# Patient Record
Sex: Female | Born: 2000 | Race: White | Hispanic: No | Marital: Single | Smoking: Never smoker
Health system: Southern US, Community
[De-identification: ages and names within clinical notes are randomized; demographics above are authoritative.]

## PROBLEM LIST (undated history)

## (undated) DIAGNOSIS — Z862 Personal history of diseases of the blood and blood-forming organs and certain disorders involving the immune mechanism: Secondary | ICD-10-CM

---
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
## 2022-07-09 ENCOUNTER — Ambulatory Visit (INDEPENDENT_AMBULATORY_CARE_PROVIDER_SITE_OTHER): Payer: No Typology Code available for payment source | Admitting: Dermatology

## 2022-07-09 ENCOUNTER — Encounter: Payer: Self-pay | Admitting: Dermatology

## 2022-07-09 DIAGNOSIS — L409 Psoriasis, unspecified: Secondary | ICD-10-CM

## 2022-07-09 MED ORDER — FLUOCINOLONE ACETONIDE SCALP 0.01 % EX OIL: TOPICAL_OIL | CUTANEOUS | 8 refills | Status: AC

## 2022-07-09 NOTE — Patient Instructions (Signed)
   [employer] PNS liquid

## 2022-07-31 ENCOUNTER — Encounter: Payer: Self-pay | Admitting: Dermatology

## 2022-07-31 NOTE — Progress Notes (Signed)
   Follow-Up Visit   Subjective  Katrina Floyd is a 21 y.o. female who presents for the following: Skin Problem (Dry flaky scalp- tx fluocinolone  oil ).  Psoriasis on scalp Location:  Duration:  Quality:  Associated Signs/Symptoms: Modifying Factors:  Severity:  Timing: Context:   Objective  Well appearing patient in no apparent distress; mood and affect are within normal limits. Mid Parietal Scalp  Plaques with moderate scale compatible with localized psoriasis.  All aspects of this disorder reviewed with the patient along with treatment options.  She does feel she gets reasonable benefit from the fluocinonide oil.    A focused examination was performed including head, neck, arms Nails. Relevant physical exam findings are noted in the Assessment and Plan.   Assessment & Plan    Psoriasis Mid Parietal Scalp  Will add calcipotriene solution (if out-of-pocket cost reasonable) which she can alternate with the fluocinonide.  She may also get P&S topical for thicker scaly areas.  Can taper therapy if improved.  Related Medications Calcipotriene 0.005 % solution Apply 1 application topically daily.  Fluocinolone Acetonide Scalp 0.01 % OIL apply one application to scalp daily at bedtime as directed      I, Janalyn Harder, MD, have reviewed all documentation for this visit.  The documentation on 07/31/22 for the exam, diagnosis, procedures, and orders are all accurate and complete.
